# Patient Record
Sex: Male | Born: 1990 | Race: White | Hispanic: No | Marital: Single
Health system: Southern US, Community
[De-identification: ages and names within clinical notes are randomized; demographics above are authoritative.]

## PROBLEM LIST (undated history)

## (undated) HISTORY — PX: DENTAL SURGERY: SHX609

---

## 2009-02-17 ENCOUNTER — Ambulatory Visit: Payer: Self-pay | Admitting: Occupational Medicine

## 2009-02-17 DIAGNOSIS — S92309A Fracture of unspecified metatarsal bone(s), unspecified foot, initial encounter for closed fracture: Secondary | ICD-10-CM | POA: Insufficient documentation

## 2009-02-18 ENCOUNTER — Ambulatory Visit: Payer: Self-pay | Admitting: Family Medicine

## 2009-08-25 ENCOUNTER — Ambulatory Visit: Payer: Self-pay | Admitting: Diagnostic Radiology

## 2009-08-25 ENCOUNTER — Emergency Department (HOSPITAL_BASED_OUTPATIENT_CLINIC_OR_DEPARTMENT_OTHER): Admission: EM | Admit: 2009-08-25 | Discharge: 2009-08-25 | Payer: Self-pay | Admitting: Emergency Medicine

## 2010-02-19 ENCOUNTER — Ambulatory Visit: Payer: Self-pay | Admitting: Family Medicine

## 2010-02-19 LAB — CONVERTED CEMR LAB
Inflenza A Ag: NEGATIVE
Protein, U semiquant: 100
WBC Urine, dipstick: NEGATIVE
pH: 6.5

## 2010-05-27 ENCOUNTER — Ambulatory Visit: Payer: Self-pay | Admitting: Diagnostic Radiology

## 2010-05-27 ENCOUNTER — Emergency Department (HOSPITAL_BASED_OUTPATIENT_CLINIC_OR_DEPARTMENT_OTHER): Admission: EM | Admit: 2010-05-27 | Discharge: 2010-05-27 | Payer: Self-pay | Admitting: Emergency Medicine

## 2010-12-23 NOTE — Letter (Signed)
Summary: Out of Work  [facility]  [address]   [place], [place] [address]   Phone: [phone]  Fax: [phone]    February 19, 2010   Employee:  JAYMIE MISCH    To Whom It May Concern:   For Medical reasons, please excuse the above named employee from work for the following dates:  Start:   02/19/2010  Return:   02/22/2010  If you need additional information, please feel free to contact our office.         Sincerely,    Hassan Rowan MD

## 2010-12-23 NOTE — Assessment & Plan Note (Signed)
Summary: NAUSEA/FEVER   Vital Signs:  Patient Profile:   20 Years Old Male CC:      HA, body aches, dizziness X this AM Height:     78 inches Weight:      190 pounds O2 Sat:      98 % O2 treatment:    Room Air Temp:     98.5 degrees F oral Pulse rate:   100 / minute Resp:     16 per minute BP sitting:   108 / 60  (right arm) Cuff size:   regular  Pt. in pain?   yes    Location:   back and legs    Type:       aching  Vitals Entered By: Lajean Saver RN (February 19, 2010 12:44 PM)                   Prior Medication List:  * NONE  ULTRAM 50 MG TABS (TRAMADOL HCL) one to two tabs every6-8 hours for pain as needed.   Updated Prior Medication List: No Medications Current Allergies (reviewed today): No known allergies History of Present Illness Chief Complaint: HA, body aches, dizziness X this AM History of Present Illness: Headache dizyness and body ache started this Am. No flu shot feels achey lower back and legs. Reports that he did not get a flu shot his year.  Current Problems: VIRAL INFECTION (ICD-079.99) INFLUENZA (ICD-487.8) CLOSED FRACTURE OF METATARSAL BONE (ICD-825.25) FAMILY HISTORY OF ASTHMA (ICD-V17.5)   Current Meds TAMIFLU 75 MG CAPS (OSELTAMIVIR PHOSPHATE) Take one capsule by mouth twice a day PROMETHAZINE HCL 25 MG  TABS (PROMETHAZINE HCL) sig 1 by mouth q6-8hrs prn * MOBIC 7.5 MG TABS (MELOXICAM) sig 1 by mouth qday for aches and pain  REVIEW OF SYSTEMS Constitutional Symptoms       Complains of fever and fatigue.     Denies chills, night sweats, weight loss, and weight gain.      Comments: Body aches Eyes       Denies change in vision, eye pain, eye discharge, glasses, contact lenses, and eye surgery. Ear/Nose/Throat/Mouth       Denies hearing loss/aids, change in hearing, ear pain, ear discharge, dizziness, frequent runny nose, frequent nose bleeds, sinus problems, sore throat, hoarseness, and tooth pain or bleeding.  Respiratory       Denies  dry cough, productive cough, wheezing, shortness of breath, asthma, bronchitis, and emphysema/COPD.  Cardiovascular       Denies murmurs, chest pain, and tires easily with exhertion.    Gastrointestinal       Denies stomach pain, nausea/vomiting, diarrhea, constipation, blood in bowel movements, and indigestion. Genitourniary       Denies painful urination, kidney stones, and loss of urinary control. Neurological       Denies paralysis, seizures, and fainting/blackouts. Musculoskeletal       Complains of muscle pain and joint pain.      Denies joint stiffness, decreased range of motion, redness, swelling, muscle weakness, and gout.      Comments: back and leg pain Skin       Denies bruising, unusual mles/lumps or sores, and hair/skin or nail changes.  Psych       Denies mood changes, temper/anger issues, anxiety/stress, speech problems, depression, and sleep problems. Other Comments: symptoms X this AM   Past History:  Family History: Last updated: 02/17/2009 Family History of Asthma  Social History: Last updated: 02/17/2009 Single Never Smoked Alcohol use-no Drug use-no  Past Medical History: Reviewed history from 02/17/2009 and no changes required. Unremarkable  Past Surgical History: Reviewed history from 02/17/2009 and no changes required. Denies surgical history  Family History: Reviewed history from 02/17/2009 and no changes required. Family History of Asthma  Social History: Reviewed history from 02/17/2009 and no changes required. Single Never Smoked Alcohol use-no Drug use-no Physical Exam General appearance: well developed, well nourished, mild distress Head: normocephalic, atraumatic Ears: normal, no lesions or deformities Nasal: pale, boggy, swollen nasal turbinates Neck: supple,anterior lymphadenopathy present Chest/Lungs: no rales, wheezes, or rhonchi bilateral, breath sounds equal without effort Heart: regular rate and  rhythm, no murmur Skin:  no obvious rashes or lesions MSE: oriented to time, place, and person Flu test negative  U/A negative Assessment New Problems: VIRAL INFECTION (ICD-079.99) INFLUENZA (ICD-487.8)  flu like syndrome w/neg flu test  Patient Education: Patient and/or caregiver instructed in the following: rest fluids and Tylenol.  Plan New Medications/Changes: MOBIC 7.5 MG TABS (MELOXICAM) sig 1 by mouth qday for aches and pain  #30 x 0, 02/19/2010, Hassan Rowan MD PROMETHAZINE HCL 25 MG  TABS (PROMETHAZINE HCL) sig 1 by mouth q6-8hrs prn  #12 x 0, 02/19/2010, Hassan Rowan MD TAMIFLU 75 MG CAPS (OSELTAMIVIR PHOSPHATE) Take one capsule by mouth twice a day  #10 x 0, 02/19/2010, Hassan Rowan MD  New Orders: New Patient Level III [phone] UA Dipstick w/o Micro (manual) [81002] Flu A+B [87400] Planning Comments:   as below  Follow Up: Follow up in 2-3 days if no improvement, Follow up on an as needed basis, Follow up with Primary Physician Work/School Excuse: Return to work/school in 3 days  The patient and/or caregiver has been counseled thoroughly with regard to medications prescribed including dosage, schedule, interactions, rationale for use, and possible side effects and they verbalize understanding.  Diagnoses and expected course of recovery discussed and will return if not improved as expected or if the condition worsens. Patient and/or caregiver verbalized understanding.  Prescriptions: MOBIC 7.5 MG TABS (MELOXICAM) sig 1 by mouth qday for aches and pain  #30 x 0   Entered and Authorized by:   Hassan Rowan MD   Signed by:   Hassan Rowan MD on 02/19/2010   Method used:   Printed then faxed to ...       [facility] [address] # [phone]* (retail)       [address] [place], [place]  [address]       Ph: [phone]       Fax: [phone]   RxID:   [id number] PROMETHAZINE HCL 25 MG  TABS (PROMETHAZINE HCL) sig 1 by mouth q6-8hrs prn  #12 x 0   Entered and Authorized by:    Hassan Rowan MD   Signed by:   Hassan Rowan MD on 02/19/2010   Method used:   Printed then faxed to ...       [facility] [address] # [phone]* (retail)       [address] [place], [place]  [address]       Ph: [phone]       Fax: [phone]   RxID:   [id number] TAMIFLU 75 MG CAPS (OSELTAMIVIR PHOSPHATE) Take one capsule by mouth twice a day  #10 x 0   Entered and Authorized by:   Hassan Rowan MD  Signed by:   Hassan Rowan MD on 02/19/2010   Method used:   Printed then faxed to ...       [facility] # [phone]* (retail)       [address] [place], [place]  [address]       Ph: [phone]       Fax: [phone]   RxID:   [id number]   Patient Instructions: 1)  flu like syndome will treat w/tamiflu 2)  Please schedule a follow-up appointment as needed. 3)  Please schedule an appointment with your primary doctor in :3- days if not better 4)  Recommended remaining out of work for next 3 days return 0n 02/21/2010  Laboratory Results   Urine Tests  Date/Time Received: February 19, 2010 1:20 PM  Date/Time Reported: February 19, 2010 1:20 PM   Routine Urinalysis   Color: orange Appearance: Clear Glucose: negative   (Normal Range: Negative) Bilirubin: small   (Normal Range: Negative) Ketone: negative   (Normal Range: Negative) Spec. Gravity: >=1.030   (Normal Range: 1.003-1.035) Blood: negative   (Normal Range: Negative) pH: 6.5   (Normal Range: 5.0-8.0) Protein: 100   (Normal Range: Negative) Urobilinogen: 0.2   (Normal Range: 0-1) Nitrite: negative   (Normal Range: Negative) Leukocyte Esterace: negative   (Normal Range: Negative)    Date/Time Received: February 19, 2010 1:20 PM  Date/Time Reported: February 19, 2010 1:20 PM   Other Tests  Influenza A: negative Influenza B: negative  Kit Test Internal QC: Negative   (Normal Range: Negative)

## 2011-03-24 IMAGING — CR DG FOOT COMPLETE 3+V*L*
3 series · 3 of 3 positions shown · non-contrast
Comparison: 02/17/2009

CLINICAL DATA: Left foot pain and swelling.

LEFT FOOT - COMPLETE 3+ VIEW

[t foot ap left]
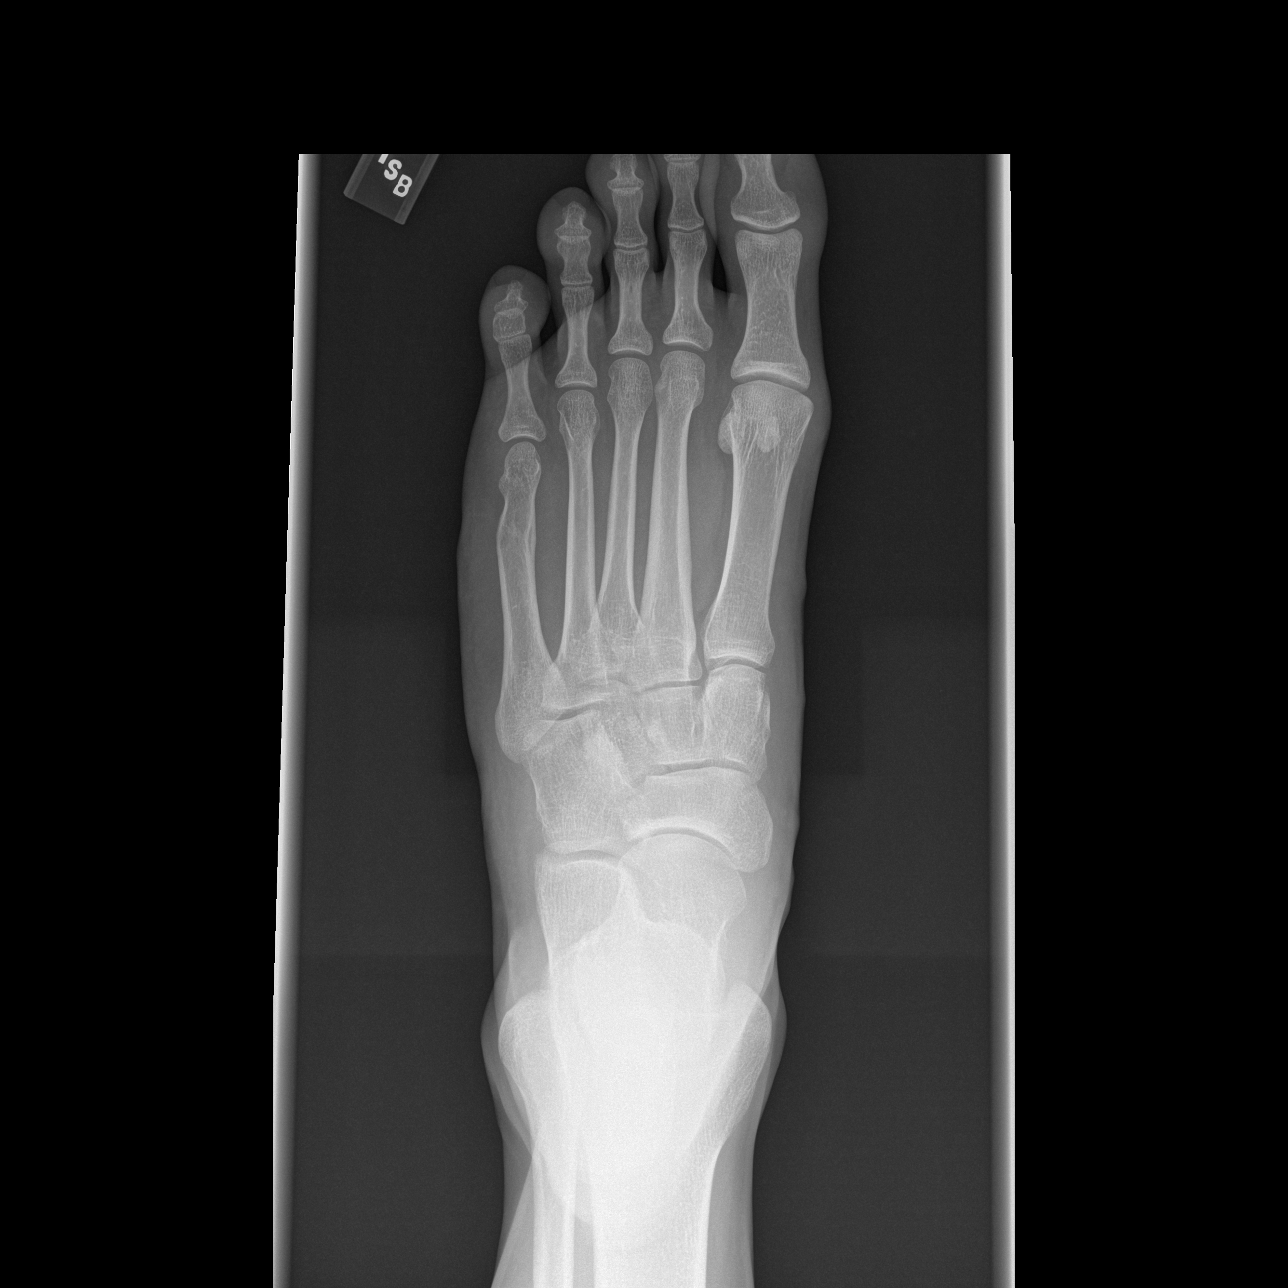

[t foot oblique left]
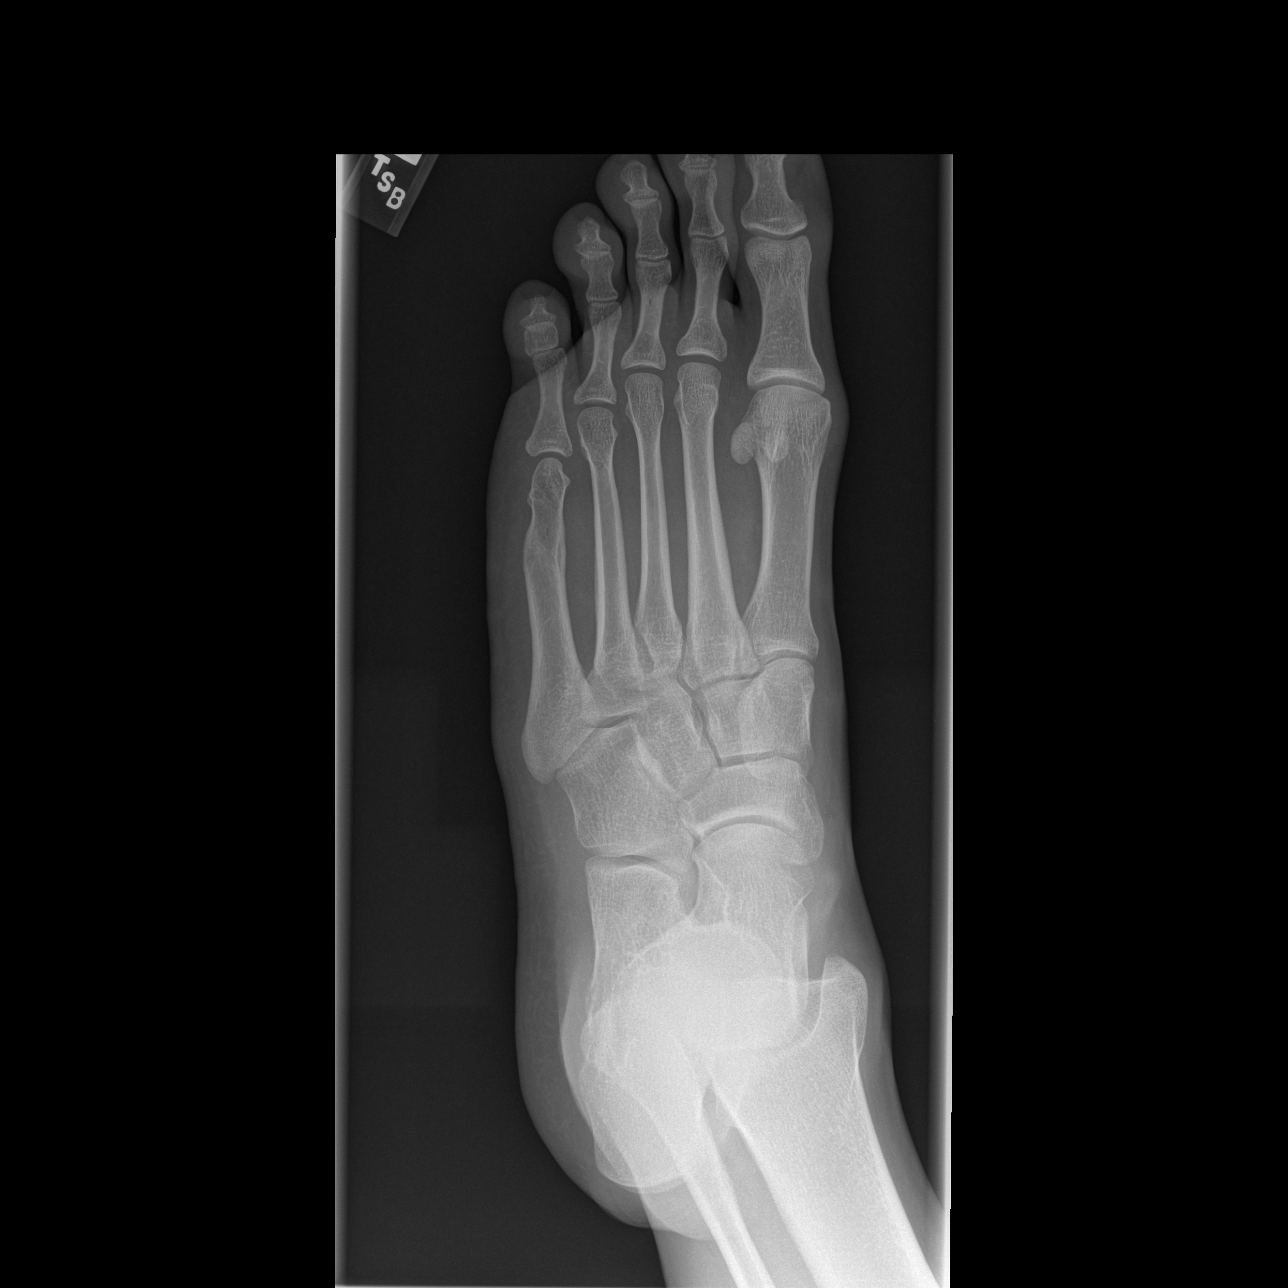

[t foot lat left]
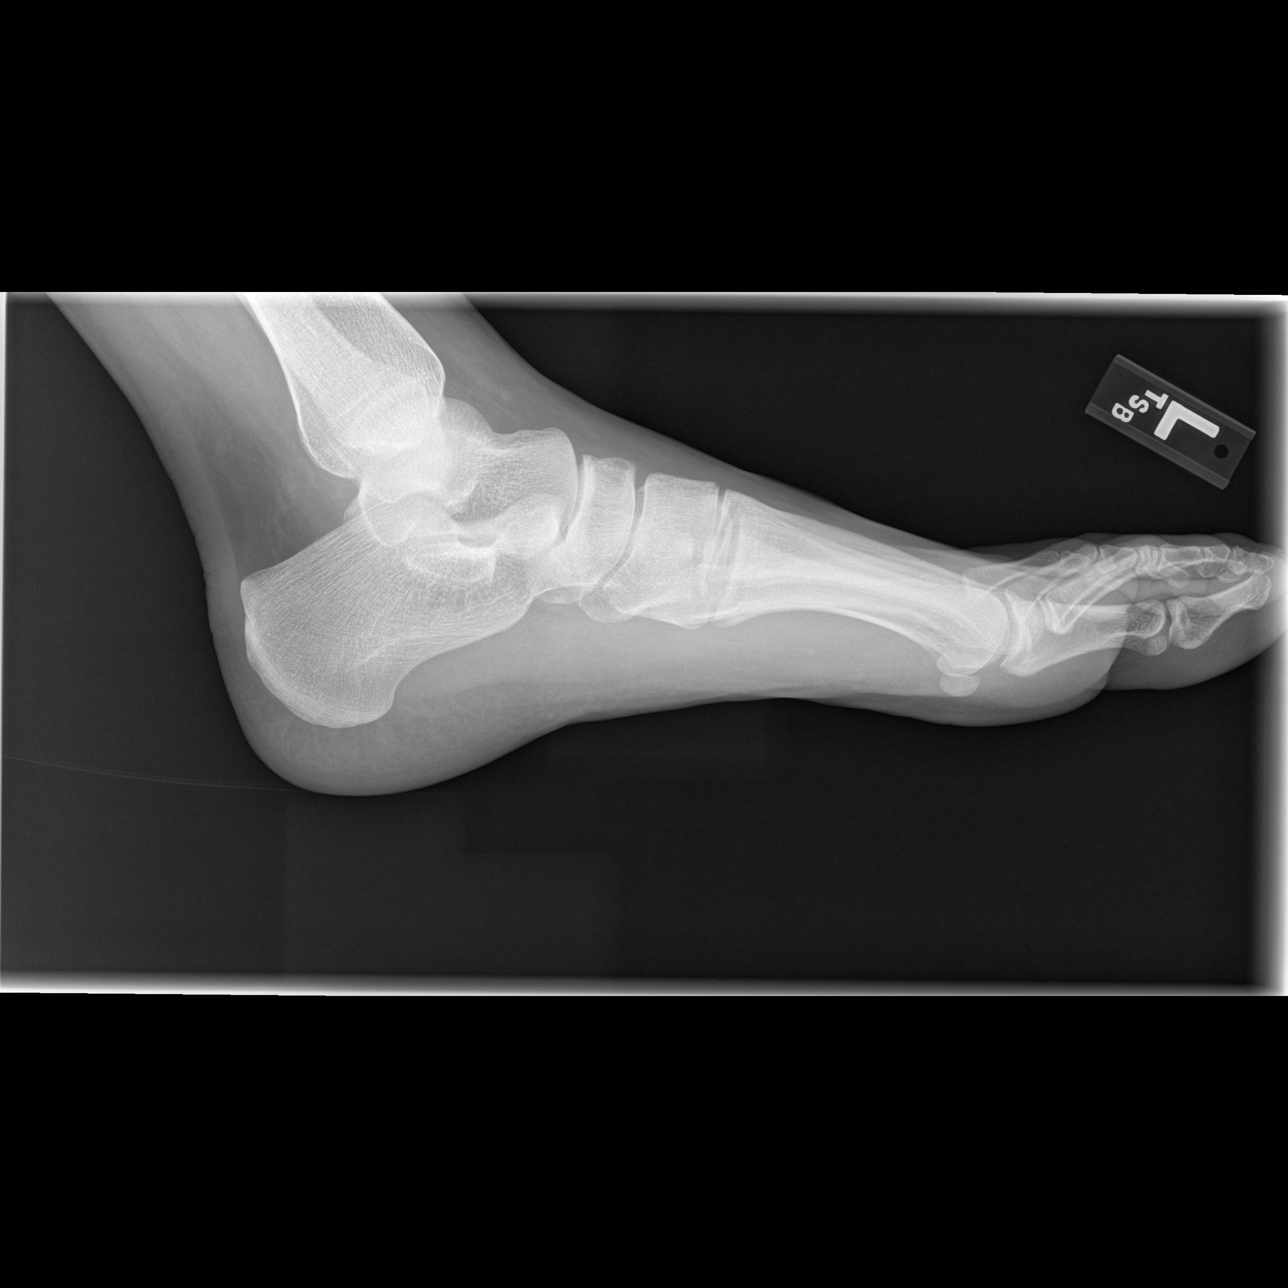

[3 of 3 positions shown; findings below may reference images not displayed]

FINDINGS: There is no acute fracture, dislocation, or other acute
bony abnormality.  There is an old well healed fracture of the
fifth metatarsal.  The previous fracture of the distal fourth
metatarsal is also well healed.
IMPRESSION: No acute abnormalities.

## 2015-03-26 ENCOUNTER — Other Ambulatory Visit: Payer: Self-pay | Admitting: Internal Medicine

## 2015-09-13 ENCOUNTER — Other Ambulatory Visit: Payer: Self-pay | Admitting: *Deleted

## 2022-07-14 ENCOUNTER — Ambulatory Visit (INDEPENDENT_AMBULATORY_CARE_PROVIDER_SITE_OTHER): Payer: No Typology Code available for payment source

## 2022-07-14 ENCOUNTER — Encounter (HOSPITAL_COMMUNITY): Payer: Self-pay | Admitting: Emergency Medicine

## 2022-07-14 ENCOUNTER — Ambulatory Visit (HOSPITAL_COMMUNITY)
Admission: EM | Admit: 2022-07-14 | Discharge: 2022-07-14 | Disposition: A | Discharge status: Home / Self Care | Payer: No Typology Code available for payment source | Attending: Family Medicine | Admitting: Family Medicine

## 2022-07-14 DIAGNOSIS — M25522 Pain in left elbow: Secondary | ICD-10-CM

## 2022-07-14 DIAGNOSIS — M778 Other enthesopathies, not elsewhere classified: Secondary | ICD-10-CM | POA: Diagnosis not present

## 2022-07-14 DIAGNOSIS — T1591XA Foreign body on external eye, part unspecified, right eye, initial encounter: Secondary | ICD-10-CM | POA: Diagnosis not present

## 2022-07-14 MED ORDER — TIZANIDINE HCL 4 MG PO TABS: 4.0000 mg | ORAL_TABLET | Freq: Two times a day (BID) | ORAL | 0 refills | Status: AC | PRN

## 2022-07-14 MED ORDER — EYE WASH OP SOLN
OPHTHALMIC | Status: AC
  Filled 2022-07-14: qty 118

## 2022-07-14 MED ORDER — INDOMETHACIN 50 MG PO CAPS: 50.0000 mg | ORAL_CAPSULE | Freq: Two times a day (BID) | ORAL | 0 refills | Status: AC | PRN

## 2022-07-14 MED ORDER — OFLOXACIN 0.3 % OP SOLN: 2.0000 [drp] | Freq: Three times a day (TID) | OPHTHALMIC | 0 refills | Status: AC

## 2022-07-14 MED ORDER — TETRACAINE HCL 0.5 % OP SOLN
OPHTHALMIC | Status: AC
  Filled 2022-07-14: qty 4

## 2022-07-14 MED ORDER — FLUORESCEIN SODIUM 1 MG OP STRP
ORAL_STRIP | OPHTHALMIC | Status: AC
  Filled 2022-07-14: qty 1

## 2022-07-14 NOTE — Discharge Instructions (Addendum)
For right eye abrasion, instill eye drops 3 times daily for 7 days. If you have any changes in vision, follow-up immediately with [facility] specialist  Elbow x-ray shows no fracture. Suspect tendinitis however, would recommend evaluation by orthopedic for further work-up as you  have obvious swelling and pain non-responsive to prednisone. For recurrent left elbow pain, recommend evaluation by Orthopedic specialist. In the meantime, I have sent Tizanidine to the pharmacy for you to take as needed at bedtime for pain and indomethacin twice daily for 5-7 days to reduce inflammation and swelling,.

## 2022-07-14 NOTE — ED Provider Notes (Signed)
[facility]    CSN: [id number] Arrival date & time: 07/14/22  1497      History   Chief Complaint Chief Complaint  Patient presents with  . Foreign Body in Eye    HPI Walter Kelley is a 31 y.o. male.   HPI Right Eye Patient reports working at home yesterday and a piece of metal flew into his right eye.  He attempted to remove metal last night and upon awakening this morning has had difficulty seeing out of his right eye due to the piece of metal in his affixed to his sclera immediately before his cornea.  He has flushed his eye multiple times without success of removing foreign body.  He is not having any pressure behind the eye or any eyelid swelling.  Left Elbow Pain and Swelling Patient [facility] few weeks [facility] for left elbow pain and swelling and reduced strength with gripping objects.  Initially the symptoms developed 4 months ago and has progressively worsened.  He is currently on a 5-day course of prednisone and reports no relief with treatment.  Would like elbow evaluated.    History reviewed. No pertinent past medical history.  Patient Active Problem List   Diagnosis Date Noted  . CLOSED FRACTURE OF METATARSAL BONE 02/17/2009    Past Surgical History:  Procedure Laterality Date  . DENTAL SURGERY         Home Medications    Prior to Admission medications   Not on File    Family History No family history on file.  Social History     Allergies   Penicillins   Review of Systems Review of Systems Pertinent negatives listed in HPI   Physical Exam Triage Vital Signs ED Triage Vitals  Enc Vitals Group     BP 07/14/22 1011 117/60     Pulse Rate 07/14/22 1011 66     Resp 07/14/22 1011 17     Temp 07/14/22 1011 98.4 F (36.9 C)     Temp Source 07/14/22 1011 Oral     SpO2 07/14/22 1011 96 %     Weight --      Height --      Head Circumference --      Peak Flow --      Pain Score 07/14/22 1009 7      Pain Loc --      Pain Edu? --      Excl. in GC? --    No data found.  Updated Vital Signs BP 117/60 (BP Location: Left Arm)   Pulse 66   Temp 98.4 F (36.9 C) (Oral)   Resp 17   SpO2 96%   Visual Acuity Right Eye Distance:   Left Eye Distance:   Bilateral Distance:    Right Eye Near:   Left Eye Near:    Bilateral Near:     Physical Exam Constitutional:      Appearance: Normal appearance.  HENT:     Head: Normocephalic.  Eyes:     General: No scleral icterus.    Extraocular Movements: Extraocular movements intact.     Conjunctiva/sclera: Conjunctivae normal.     Pupils: Pupils are equal, round, and reactive to light.     Right eye: Corneal abrasion and fluorescein uptake present.     Left eye: Pupil is round, reactive and not sluggish. No corneal abrasion or fluorescein uptake.     Comments: Dark piece of localized debris flushed from  eye and completely removed  Cardiovascular:     Rate and Rhythm: Normal rate and regular rhythm.  Pulmonary:     Effort: Pulmonary effort is normal.     Breath sounds: Normal breath sounds.  Skin:    Capillary Refill: Capillary refill takes less than 2 seconds.  Neurological:     General: No focal deficit present.     Mental Status: He is alert and oriented to person, place, and time.    UC Treatments / Results  Labs (all labs ordered are listed, but only abnormal results are displayed) Labs Reviewed - No data to display  EKG   Radiology No results found.  Procedures Procedures (including critical care time)  Medications Ordered in UC Medications - No data to display  Initial Impression / Assessment and Plan / UC Course  I have reviewed the triage vital signs and the nursing notes.  Pertinent labs & imaging results that were available during my care of the patient were reviewed by me and considered in my medical decision making (see chart for details).     Final Clinical Impressions(s) / UC Diagnoses   Final  diagnoses:  None   Discharge Instructions   None    ED Prescriptions   None    PDMP not reviewed this encounter.

## 2022-07-14 NOTE — ED Triage Notes (Signed)
Reports got something in right eye last night. Reports something still in eye. Reports hard time opening eye due to pain.   Also having left elbow pain that has been ongoing and [facility] but only given [facility] that hasnt helped.
